# Patient Record
Sex: Male | Born: 1955 | Race: Black or African American | Hispanic: No | Marital: Married | Smoking: Never smoker
Health system: Southern US, Community
[De-identification: ages and names within clinical notes are randomized; demographics above are authoritative.]

---

## 2007-03-09 ENCOUNTER — Emergency Department: Payer: Self-pay | Admitting: Emergency Medicine

## 2012-08-31 ENCOUNTER — Emergency Department: Payer: Self-pay | Admitting: Emergency Medicine

## 2012-08-31 LAB — CBC
HCT: 40.8 % (ref 40.0–52.0)
MCH: 34.3 pg — ABNORMAL HIGH (ref 26.0–34.0)
MCV: 100 fL (ref 80–100)
Platelet: 182 10*3/uL (ref 150–440)
RBC: 4.09 10*6/uL — ABNORMAL LOW (ref 4.40–5.90)
WBC: 4 10*3/uL (ref 3.8–10.6)

## 2012-08-31 LAB — BASIC METABOLIC PANEL
Anion Gap: 10 (ref 7–16)
BUN: 8 mg/dL (ref 7–18)
Creatinine: 0.9 mg/dL (ref 0.60–1.30)
EGFR (African American): >60
Glucose: 104 mg/dL — ABNORMAL HIGH (ref 65–99)
Osmolality: 278 (ref 275–301)
Sodium: 140 mmol/L (ref 136–145)

## 2012-08-31 LAB — TROPONIN I: Troponin-I: <0.02 ng/mL

## 2012-09-01 LAB — HEPATIC FUNCTION PANEL A (ARMC)
Alkaline Phosphatase: 95 U/L (ref 50–136)
Bilirubin, Direct: 0.2 mg/dL (ref 0.00–0.20)
SGOT(AST): 193 U/L — ABNORMAL HIGH (ref 15–37)
SGPT (ALT): 82 U/L — ABNORMAL HIGH (ref 12–78)
Total Protein: 8 g/dL (ref 6.4–8.2)

## 2012-09-01 LAB — ETHANOL: Ethanol: 192 mg/dL

## 2012-10-02 ENCOUNTER — Emergency Department: Payer: Self-pay

## 2012-10-02 LAB — URINALYSIS, COMPLETE
Ketone: NEGATIVE
Nitrite: NEGATIVE
Protein: NEGATIVE
RBC,UR: <1 /HPF (ref 0–5)
Specific Gravity: 1.038 (ref 1.003–1.030)
WBC UR: <1 /HPF (ref 0–5)

## 2012-10-02 LAB — COMPREHENSIVE METABOLIC PANEL
Alkaline Phosphatase: 109 U/L (ref 50–136)
Anion Gap: 8 (ref 7–16)
Calcium, Total: 8.8 mg/dL (ref 8.5–10.1)
Chloride: 113 mmol/L — ABNORMAL HIGH (ref 98–107)
Co2: 25 mmol/L (ref 21–32)
EGFR (Non-African Amer.): >60
Glucose: 83 mg/dL (ref 65–99)
Osmolality: 289 (ref 275–301)
SGOT(AST): 148 U/L — ABNORMAL HIGH (ref 15–37)
SGPT (ALT): 68 U/L (ref 12–78)
Total Protein: 8.2 g/dL (ref 6.4–8.2)

## 2012-10-02 LAB — CBC
HCT: 39.6 % — ABNORMAL LOW (ref 40.0–52.0)
HGB: 13.5 g/dL (ref 13.0–18.0)
MCHC: 34.2 g/dL (ref 32.0–36.0)
MCV: 100 fL (ref 80–100)
RBC: 3.98 10*6/uL — ABNORMAL LOW (ref 4.40–5.90)
RDW: 13.6 % (ref 11.5–14.5)
WBC: 3.9 10*3/uL (ref 3.8–10.6)

## 2012-10-02 LAB — DRUG SCREEN, URINE
Barbiturates, Ur Screen: NEGATIVE (ref ?–200)
Benzodiazepine, Ur Scrn: NEGATIVE (ref ?–200)
Cannabinoid 50 Ng, Ur ~~LOC~~: POSITIVE (ref ?–50)
Cocaine Metabolite,Ur ~~LOC~~: POSITIVE (ref ?–300)
MDMA (Ecstasy)Ur Screen: NEGATIVE (ref ?–500)
Opiate, Ur Screen: NEGATIVE (ref ?–300)
Tricyclic, Ur Screen: NEGATIVE (ref ?–1000)

## 2012-10-02 LAB — ETHANOL: Ethanol: 229 mg/dL

## 2012-11-09 ENCOUNTER — Emergency Department: Payer: Self-pay | Admitting: Unknown Physician Specialty

## 2012-11-09 LAB — COMPREHENSIVE METABOLIC PANEL
Anion Gap: 8 (ref 7–16)
BUN: 17 mg/dL (ref 7–18)
Bilirubin,Total: 1.1 mg/dL — ABNORMAL HIGH (ref 0.2–1.0)
Calcium, Total: 9 mg/dL (ref 8.5–10.1)
Chloride: 100 mmol/L (ref 98–107)
Creatinine: 1.07 mg/dL (ref 0.60–1.30)
EGFR (African American): >60
Potassium: 4 mmol/L (ref 3.5–5.1)
SGOT(AST): 90 U/L — ABNORMAL HIGH (ref 15–37)
SGPT (ALT): 53 U/L (ref 12–78)
Sodium: 136 mmol/L (ref 136–145)

## 2012-11-09 LAB — CBC
HCT: 39.5 % — ABNORMAL LOW (ref 40.0–52.0)
HGB: 13.5 g/dL (ref 13.0–18.0)
MCH: 34.3 pg — ABNORMAL HIGH (ref 26.0–34.0)
MCHC: 34.3 g/dL (ref 32.0–36.0)
MCV: 100 fL (ref 80–100)
RBC: 3.95 10*6/uL — ABNORMAL LOW (ref 4.40–5.90)
RDW: 14.1 % (ref 11.5–14.5)

## 2012-11-09 LAB — URINALYSIS, COMPLETE
Bilirubin,UR: NEGATIVE
Blood: NEGATIVE
Glucose,UR: NEGATIVE mg/dL (ref 0–75)
Nitrite: NEGATIVE
Ph: 5 (ref 4.5–8.0)
Specific Gravity: 1.03 (ref 1.003–1.030)
WBC UR: 13 /HPF (ref 0–5)

## 2012-11-09 LAB — LIPASE, BLOOD: Lipase: 195 U/L (ref 73–393)

## 2012-11-09 LAB — ETHANOL
Ethanol %: <0.003 % (ref 0.000–0.080)
Ethanol: <3 mg/dL

## 2012-11-10 ENCOUNTER — Emergency Department: Payer: Self-pay | Admitting: Emergency Medicine

## 2012-11-10 LAB — DRUG SCREEN, URINE
Barbiturates, Ur Screen: NEGATIVE (ref ?–200)
Benzodiazepine, Ur Scrn: NEGATIVE (ref ?–200)
Cannabinoid 50 Ng, Ur ~~LOC~~: POSITIVE (ref ?–50)
Cocaine Metabolite,Ur ~~LOC~~: POSITIVE (ref ?–300)
Methadone, Ur Screen: NEGATIVE (ref ?–300)
Opiate, Ur Screen: NEGATIVE (ref ?–300)
Phencyclidine (PCP) Ur S: NEGATIVE (ref ?–25)
Tricyclic, Ur Screen: NEGATIVE (ref ?–1000)

## 2012-11-10 LAB — CK TOTAL AND CKMB (NOT AT ARMC): CK-MB: 0.7 ng/mL (ref 0.5–3.6)

## 2015-03-18 IMAGING — CR DG CHEST 2V
1 series · 2 of 2 positions shown · non-contrast
Comparison: none

REASON FOR EXAM: Chest Pain
COMMENTS:

PROCEDURE:     DXR - DXR CHEST PA (OR AP) AND LATERAL  - August 31, 2012 [DATE]
RESULT:     The lungs are clear. The cardiac silhouette and visualized bony
skeleton are unremarkable.

[Series 1: w chest pa · 0.14mm/px · 2 of 2 slices shown]
[im 1/2]
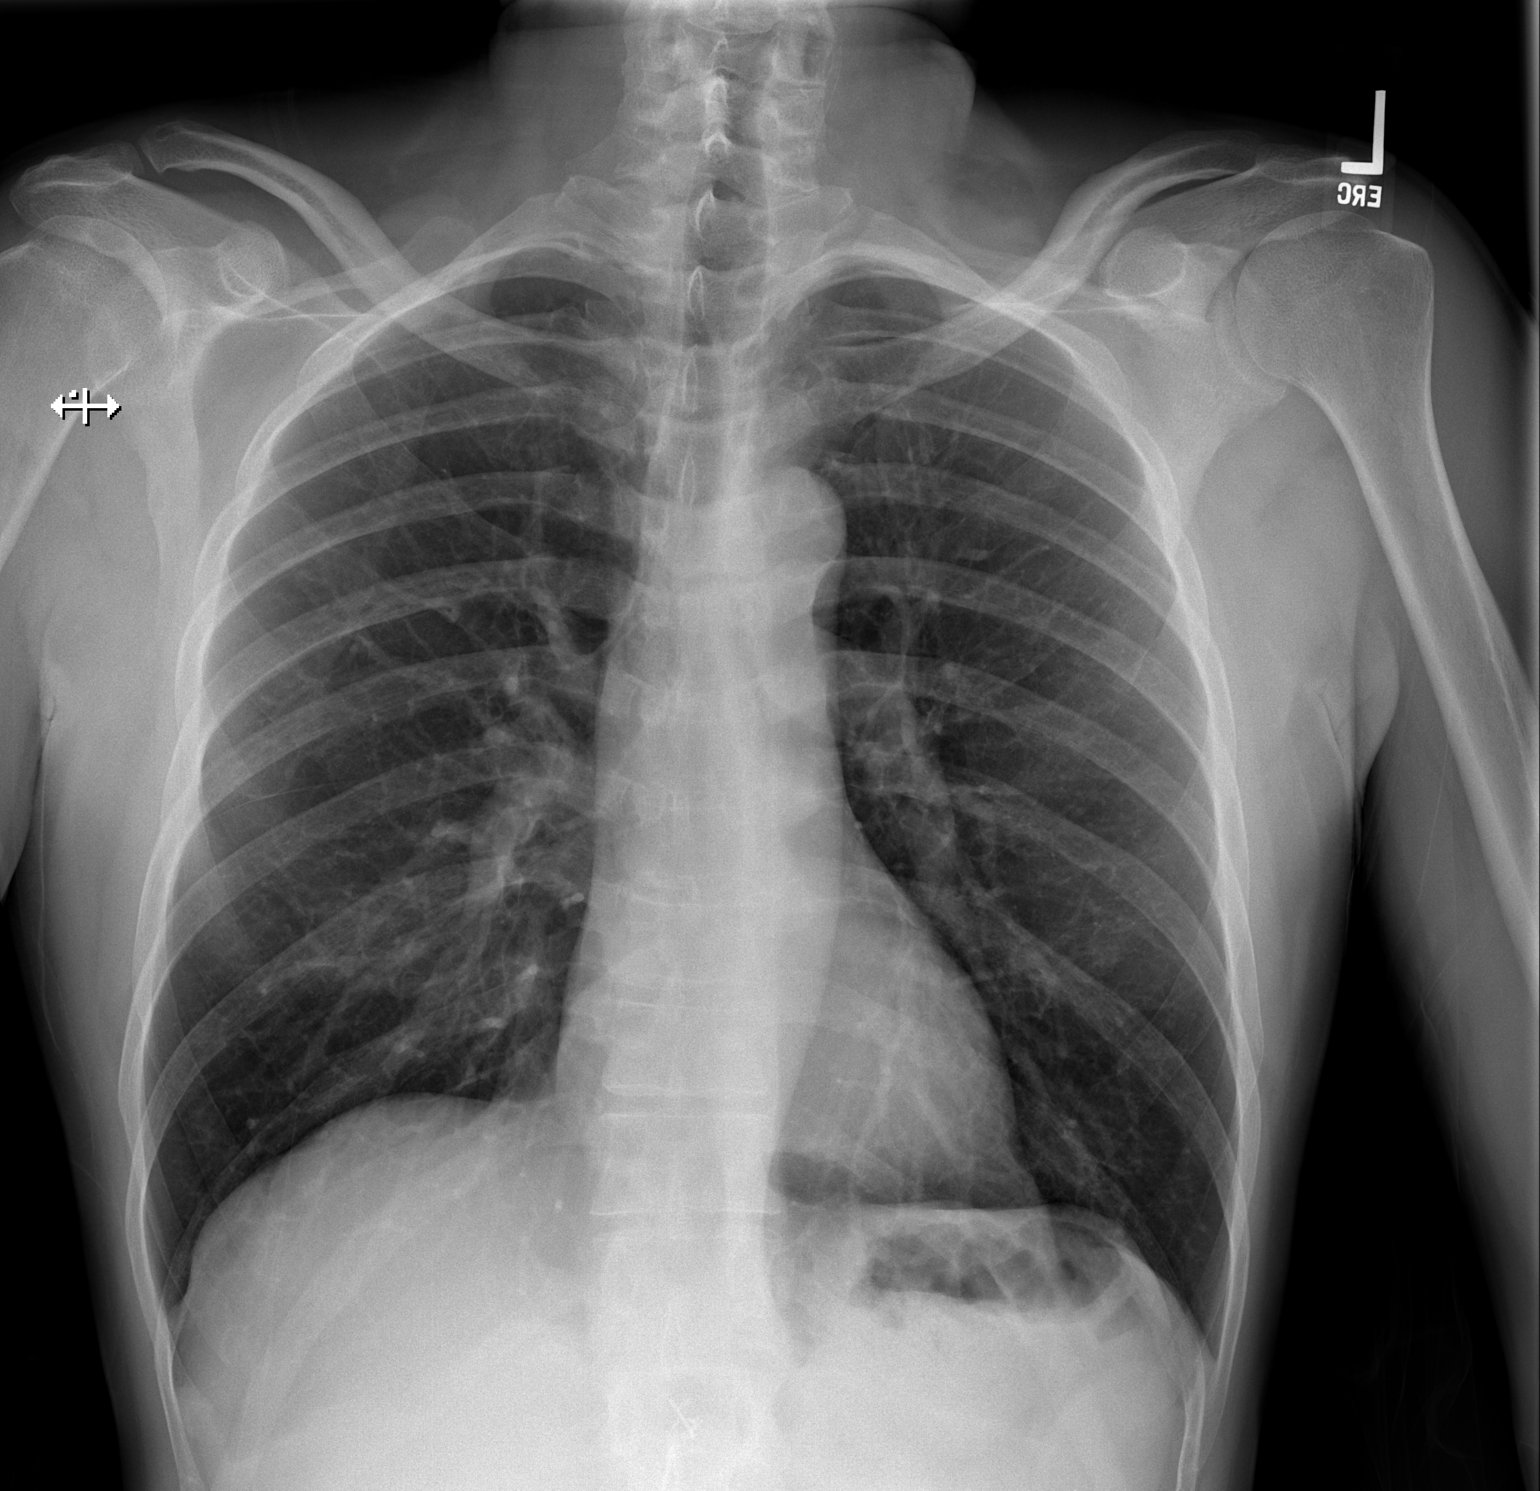
[im 2/2]
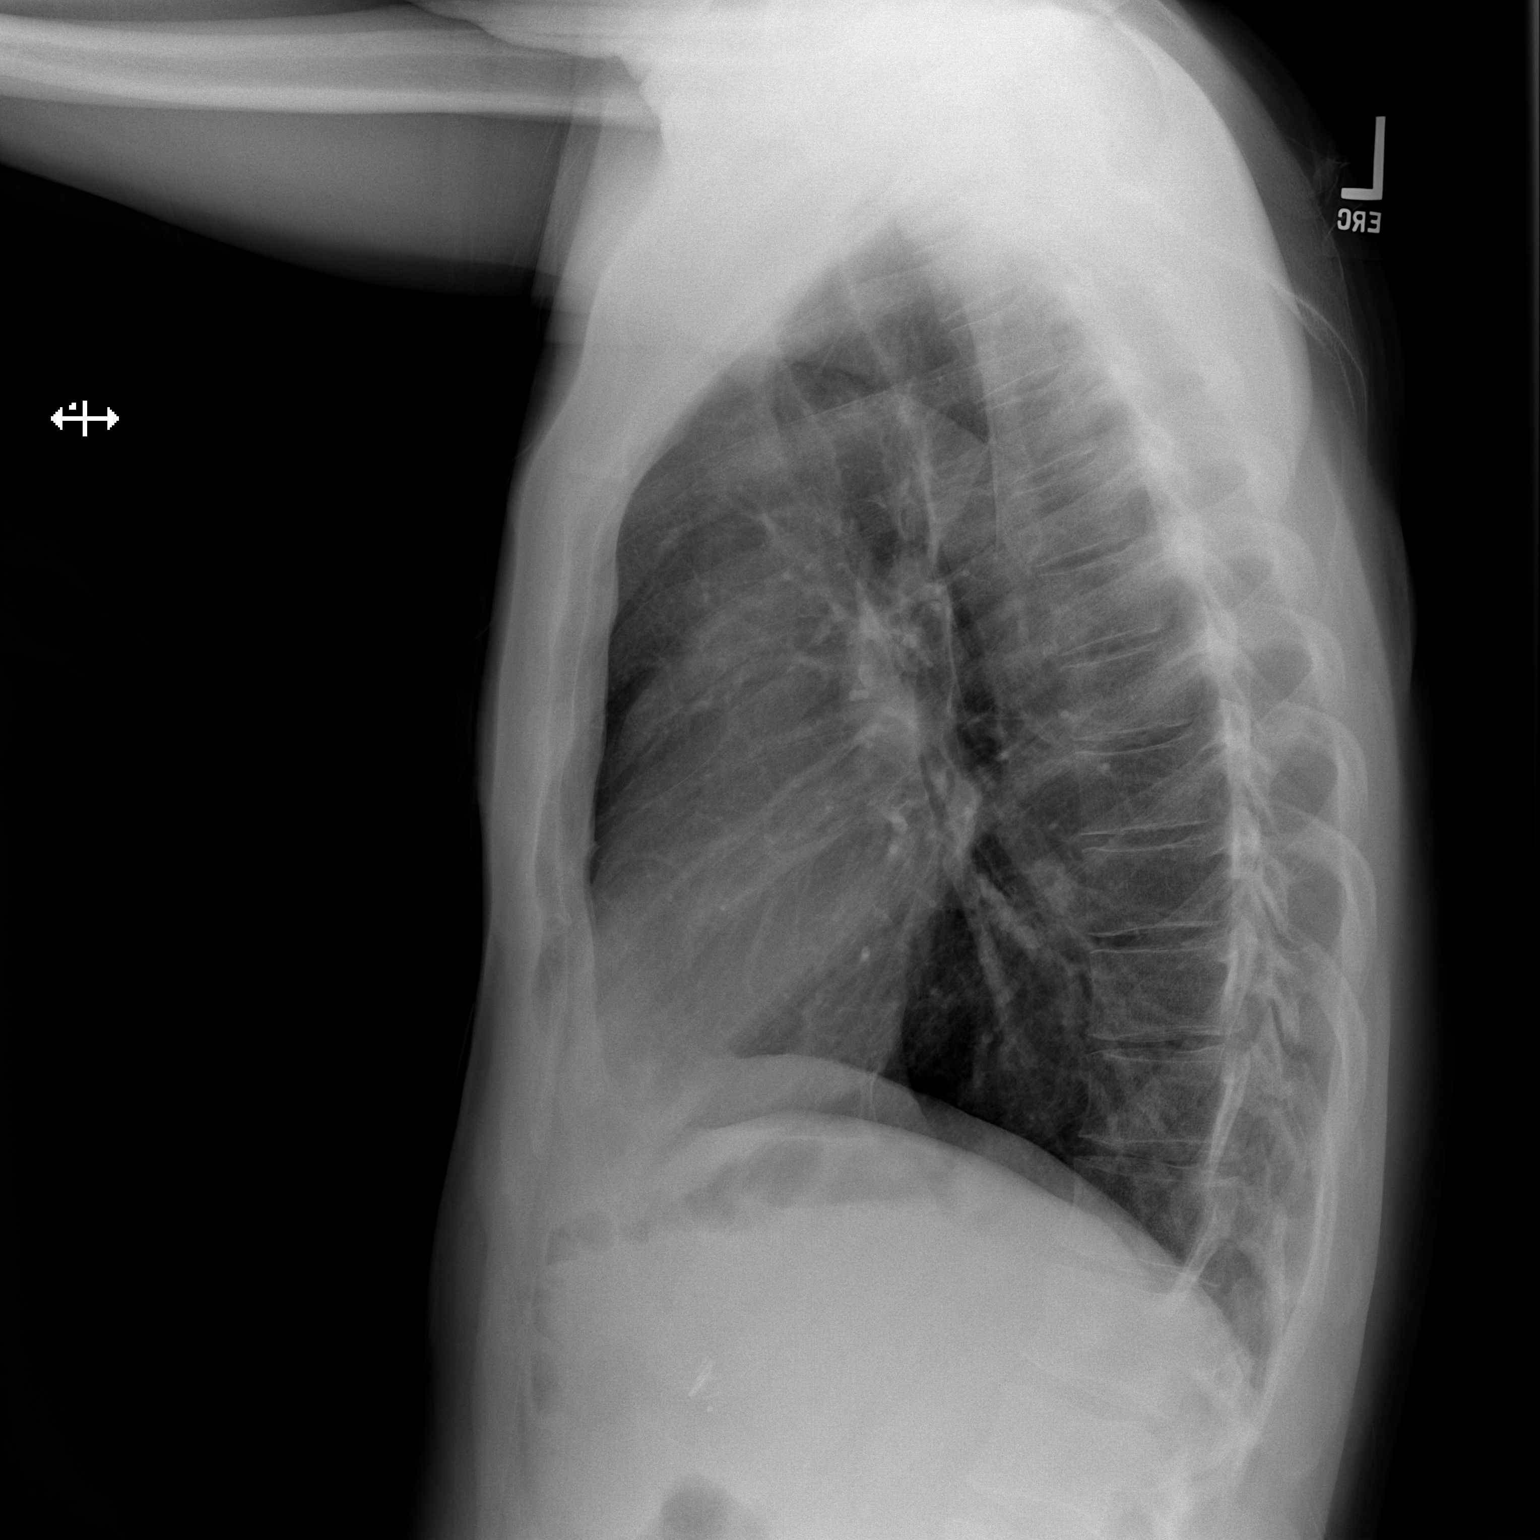

[2 of 2 positions shown; findings below may reference images not displayed]

IMPRESSION: 1. Chest radiograph without evidence of acute cardiopulmonary disease.

## 2022-05-22 ENCOUNTER — Emergency Department: Payer: Self-pay

## 2022-05-22 ENCOUNTER — Other Ambulatory Visit: Payer: Self-pay

## 2022-05-22 ENCOUNTER — Emergency Department
Admission: EM | Admit: 2022-05-22 | Discharge: 2022-05-22 | Disposition: A | Discharge status: Home / Self Care | Payer: Self-pay | Attending: Emergency Medicine | Admitting: Emergency Medicine

## 2022-05-22 DIAGNOSIS — F10129 Alcohol abuse with intoxication, unspecified: Secondary | ICD-10-CM | POA: Insufficient documentation

## 2022-05-22 DIAGNOSIS — W19XXXA Unspecified fall, initial encounter: Secondary | ICD-10-CM

## 2022-05-22 DIAGNOSIS — W1830XA Fall on same level, unspecified, initial encounter: Secondary | ICD-10-CM | POA: Insufficient documentation

## 2022-05-22 DIAGNOSIS — F10920 Alcohol use, unspecified with intoxication, uncomplicated: Secondary | ICD-10-CM

## 2022-05-22 MED ORDER — ACETAMINOPHEN 500 MG PO TABS
1000.0000 mg | ORAL_TABLET | Freq: Once | ORAL | Status: AC
  Administered 2022-05-22: 1000 mg via ORAL
  Filled 2022-05-22: qty 2
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
## 2022-05-22 NOTE — ED Triage Notes (Signed)
Pt BIB EMS, called for fall. Pt states his boots "slipped out from under" him. ETOH on board, c/o pain from "her to here," pt is pointing to his abdomen and down his legs. Is able to move all extremities. Pt is unwilling to provide more information.

## 2022-05-22 NOTE — ED Notes (Signed)
Pt unwilling/unable to actively participate in triage, pt is making repetitive statements and not answering questions

## 2022-05-22 NOTE — Discharge Instructions (Addendum)
CAT scans of your head and neck did not show any abnormality.  We also got x-rays of your spine and pelvis which did not show any broken bones.  You can take Tylenol for pain.  If any your pain is worsening you develop any numbness or weakness in your legs please return the emergency department.

## 2022-05-25 ENCOUNTER — Encounter: Payer: Self-pay | Admitting: Emergency Medicine

## 2022-05-25 DIAGNOSIS — U071 COVID-19: Secondary | ICD-10-CM | POA: Insufficient documentation

## 2022-05-25 LAB — RESP PANEL BY RT-PCR (RSV, FLU A&B, COVID)  RVPGX2
Influenza A by PCR: NEGATIVE
Influenza B by PCR: NEGATIVE
Resp Syncytial Virus by PCR: NEGATIVE
SARS Coronavirus 2 by RT PCR: POSITIVE — AB
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
## 2022-05-26 ENCOUNTER — Emergency Department
Admission: EM | Admit: 2022-05-26 | Discharge: 2022-05-26 | Disposition: A | Discharge status: Home / Self Care | Payer: Self-pay | Attending: Emergency Medicine | Admitting: Emergency Medicine

## 2022-05-26 DIAGNOSIS — U071 COVID-19: Secondary | ICD-10-CM

## 2022-05-26 MED ORDER — NIRMATRELVIR/RITONAVIR (PAXLOVID)TABLET: 3.0000 | ORAL_TABLET | Freq: Two times a day (BID) | ORAL | 0 refills | Status: AC

## 2022-05-26 MED ORDER — IBUPROFEN 800 MG PO TABS
800.0000 mg | ORAL_TABLET | Freq: Once | ORAL | Status: AC
  Administered 2022-05-26: 800 mg via ORAL
  Filled 2022-05-26: qty 1

## 2022-05-26 NOTE — ED Notes (Signed)
E-signature pad unavailable - Pt verbalized understanding of D/C information - no additional concerns at this time.  

## 2022-05-26 NOTE — ED Notes (Addendum)
Pt upset stating he wanted "XR & CT's done on my chest". This RN explained to the patient that he has been thoroughly assessed by an ED provider and the additional testing wasn't warranted. Pt verbalized understanding and escorted to the lobby.

## 2022-05-26 NOTE — Discharge Instructions (Addendum)
You have been diagnosed with COVID 19.  This is a virus that can cause many different symptoms and can be extremely contagious.    You may be eligible for outpatient antiviral treatments for COVID 19 such as Paxlovid, Molnupiravir if you are within the first 5 days of symptoms. You do not need antibiotics for COVID 19 since it is a virus.  You may use over the counter medications to help manage your symptoms at home.    You may alternate Tylenol 1000 mg every 6 hours as needed for pain, fever (as long as you have no history of liver dysfunction, heavy alcohol use) and Ibuprofen 800 mg every 8 hours as needed for pain, fever (as long as you have no history of kidney dysfunction, upper GI gleed, gastritis/ulcer).  Please take Ibuprofen with food.  Do not take more than 4000 mg of Tylenol (acetaminophen) in a 24 hour period.  Please rest and drink plenty of fluids.  You will need to quarantine from others for five days (first day of symptoms is DAY ZERO).  If your symptoms are improving or resolved at the end of this time frame, you may come out of quarantine but will need to wear a well fitted mask when around others for the next 5 days.   The best way to protect yourself and others from [place] 19 and potential long term complications is to be vaccinated and receive boosters as recommended by the [facility] and your primary care provider.  If you develop shortness of breath, blue lips or blue fingertips, vomiting that does not stop, chest pain, confusion, become severely weak or feel you may pass out, please return to the closest emergency department.   Please go to the following website to schedule new (and existing) patient appointments:   [email or web address]   The following is a list of primary care offices in the area who are accepting new patients at this time.  Please reach out to one of them directly and let them know you would like to schedule an appointment to follow  up on an Emergency Department visit, and/or to establish a new primary care provider (PCP).  There are likely other primary care clinics in the are who are accepting new patients, but this is an excellent place to start:  [facility] physician: Dr Lavon Paganini [address] [place], [place] [address] [phone]  [facility] Lead Physician: Dr Steele Sizer [address], [place], [place] [address] [phone]  [facility] Physician: Dr Park Liter [address] [employer] [place], [place] [address] [phone]  [facility] Lead Physician: Dr Dewaine Oats [address] [place], [place] [address] [phone]  [facility] Physician: Dr Halina Maidens [address] [place], [place],  [address] [phone]

## 2022-05-26 NOTE — ED Provider Notes (Signed)
[facility] Provider Note    Event Date/Time   First MD Initiated Contact with Patient 05/26/22 0030     (approximate)   History   Cough   HPI  Tony Murray is a 67 y.o. male with history of alcohol abuse who presents to the emergency department with complaints of not feeling well today.  Brother and mother tested positive for COVID-19 and mother is currently in the hospital.  Reports cough, body aches.  No shortness of breath.  No vomiting or diarrhea.  Has been drinking alcohol the night.   History provided by patient.    History reviewed. No pertinent past medical history.  History reviewed. No pertinent surgical history.  MEDICATIONS:  Prior to Admission medications   Not on File    Physical Exam   Triage Vital Signs: ED Triage Vitals  Enc Vitals Group     BP 05/25/22 2208 (!) 113/58     Pulse Rate 05/25/22 2208 79     Resp 05/25/22 2208 18     Temp 05/25/22 2208 98 F (36.7 C)     Temp Source 05/25/22 2208 Oral     SpO2 05/25/22 2208 98 %     Weight 05/25/22 2207 180 lb (81.6 kg)     Height 05/25/22 2207 '6\' 2"'$  (1.88 m)     Head Circumference --      Peak Flow --      Pain Score 05/25/22 2207 0     Pain Loc --      Pain Edu? --      Excl. in [place]? --     Most recent vital signs: Vitals:   05/25/22 2208  BP: (!) 113/58  Pulse: 79  Resp: 18  Temp: 98 F (36.7 C)  SpO2: 98%    CONSTITUTIONAL: Alert, responds appropriately to questions. Well-appearing; well-nourished HEAD: Normocephalic, atraumatic EYES: Conjunctivae clear, pupils appear equal, sclera nonicteric ENT: normal nose; moist mucous membranes NECK: Supple, normal ROM CARD: RRR; S1 and S2 appreciated RESP: Normal chest excursion without splinting or tachypnea; breath sounds clear and equal bilaterally; no wheezes, no rhonchi, no rales, no hypoxia or respiratory distress, speaking full sentences ABD/GI: Non-distended; soft, non-tender, no rebound, no guarding, no  peritoneal signs BACK: The back appears normal EXT: Normal ROM in all joints; no deformity noted, no edema SKIN: Normal color for age and race; warm; no rash on exposed skin NEURO: Moves all extremities equally, normal speech PSYCH: The patient's mood and manner are appropriate.   ED Results / Procedures / Treatments   LABS: (all labs ordered are listed, but only abnormal results are displayed) Labs Reviewed  RESP PANEL BY RT-PCR (RSV, FLU A&B, COVID)  [id number] - Abnormal; Notable for the following components:      Result Value   SARS Coronavirus 2 by RT PCR POSITIVE (*)    All other components within normal limits     EKG:   RADIOLOGY: My personal review and interpretation of imaging:    I have personally reviewed all radiology reports.   No results found.   PROCEDURES:  Critical Care performed: No     Procedures    IMPRESSION / MDM / ASSESSMENT AND PLAN / ED COURSE  I reviewed the triage vital signs and the nursing notes.    Patient here with symptoms of viral URI with recent exposure to COVID-19.     DIFFERENTIAL DIAGNOSIS (includes but not limited to):   COVID, flu, other viral URI,  pneumonia   Patient's presentation is most consistent with acute complicated illness / injury requiring diagnostic workup.   PLAN: Patient is COVID-positive.  Within treatment window for Paxlovid.  Provided prescription today.  Will give ibuprofen here.  Patient has been drinking alcohol but ambulatory, eating and drinking.  Will discharge with supportive care instructions.   MEDICATIONS GIVEN IN ED: Medications  ibuprofen (ADVIL) tablet 800 mg (has no administration in time range)     ED COURSE:  At this time, I do not feel there is any life-threatening condition present. I reviewed all nursing notes, vitals, pertinent previous records.  All lab and urine results, EKGs, imaging ordered have been independently reviewed and interpreted by myself.  I reviewed all available  radiology reports from any imaging ordered this visit.  Based on my assessment, I feel the patient is safe to be discharged home without further emergent workup and can continue workup as an outpatient as needed. Discussed all findings, treatment plan as well as usual and customary return precautions.  They verbalize understanding and are comfortable with this plan.  Outpatient follow-up has been provided as needed.  All questions have been answered.    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       FINAL CLINICAL IMPRESSION(S) / ED DIAGNOSES   Final diagnoses:  [id number]     Rx / DC Orders   ED Discharge Orders          Ordered    nirmatrelvir/ritonavir (PAXLOVID) 20 x 150 MG & 10 x '100MG'$  TABS  2 times daily        05/26/22 0031             Note:  This document was prepared using Dragon voice recognition software and may include unintentional dictation errors.   Esli Clements, Delice Bison, DO 05/26/22 [phone]

## 2024-03-26 NOTE — ED Provider Notes (Signed)
 ------------------------------------------------------------------------------- Attestation signed by Manette Arlean POUR, DO at 04/06/2024  8:34 AM For this patient encounter, I was the Clinical Oversight Physician. This includes discussion of the plan of care for this patient, independent examination, and a substantive portion of the visit completed by me, the physician. I personally made/approved the management plan and take responsibility for the patient management.  The substantive portion I performed was: medical decision making.  I was the supervising physician for procedure. I reviewed images and agree   Arlean POUR Manette, DO -------------------------------------------------------------------------------   ED Provider Note  Subjective   History of Present Illness This is a 68 year old male presenting with a painful mass on his back that has spontaneous discharge.  The patient reports that the mass on his back began causing discomfort approximately 3 to 4 days ago, with drainage starting last night. The discharge is described as whitish-yellow in color and malodorous. He recalls a similar incident several years ago but is uncertain if it was in the same location. He reports no systemic symptoms such as fever, chills, night sweats, or vomiting. He also reports no headaches.   Objective   Vitals <redacted file path>: ED Triage Vitals [03/26/24 0845]  BP Pulse Resp Temp SpO2 O2 Flow Rate (L/min)  (!) 174/74 79 16 98 F (36.7 C) 97 % --   Physical Exam Vitals and nursing note reviewed.  HENT:     Head: Normocephalic and atraumatic.     Right Ear: External ear normal.     Left Ear: External ear normal.     Nose: No rhinorrhea.  Eyes:     Extraocular Movements: Extraocular movements intact.     Conjunctiva/sclera: Conjunctivae normal.     Pupils: Pupils are equal, round, and reactive to light.  Cardiovascular:     Rate and Rhythm: Normal rate and regular rhythm.     Pulses: Normal  pulses.     Heart sounds: Normal heart sounds.  Pulmonary:     Effort: Pulmonary effort is normal.     Breath sounds: Normal breath sounds. No wheezing or rhonchi.  Chest:     Chest wall: No tenderness.  Abdominal:     Tenderness: There is no abdominal tenderness. There is no guarding.  Musculoskeletal:        General: No deformity or signs of injury.     Right lower leg: No edema.     Left lower leg: No edema.  Skin:    General: Skin is warm and dry.     Capillary Refill: Capillary refill takes less than 2 seconds.     Coloration: Skin is not pale.     Findings: Erythema and lesion present. No rash.     Comments: 5 cm by 3 cm mass on the right side of back and midclavicular line.  Picture included below.  Will ultrasound for further diagnostic value  Neurological:     General: No focal deficit present.     Mental Status: He is oriented to person, place, and time. Mental status is at baseline.     Motor: No weakness.     Coordination: Coordination normal.     Gait: Gait normal.  Psychiatric:        Mood and Affect: Mood normal.        Behavior: Behavior normal.        Thought Content: Thought content normal.        Assessment and Plan:     Medical Decision Making 68 year old male presenting with  right mid back mass that is recurrent.  Ultrasound to evaluate if superficial versus deep connecting mass.  Ultrasound shows superficial fluid collection concerning for abscess or cyst.  As patient is having severe pain with this will provide symptomatic relief with incision and drainage this also provide diagnostic value through fluid type.  Fluid was purulent in nature concerning for abscess and infection.  No signs of cellulitis at this time, no erythema, induration and no cobblestoning on ultrasound.  There is a scar that is well-healing overlying this mass indicating as this is recurrent in nature will have patient follow-up with dermatology and have doxycycline  antibiotic  prescription.  Patient is hemodynamically stable without fever or other systemic signs of infection at this time.  He is safe for discharge home and outpatient follow-up. provided patient with return precaution instructions as well as follow-up instructions.  He expressed understanding and agreement to plan.  See ED course for additional information.  Risk OTC drugs. Prescription drug management.   Risk in the MDM section refers to billing criteria on potential for complications and/or morbidity/mortality of management as defined by the [facility] and CMS  ED Course <redacted file path>:     Procedure: POC US  LIMITED - ABSCESS EVAL  Date/Time: 03/26/2024 9:46 AM  Performed by: Mathew Marty BIRCH, PA-C Authorized by: Manette Arlean POUR, DO   Exam Occurrence/ Category:  Original Exam Findings:    Indication/ Medical Necessity:  Skin swelling, Mass and Pain   Location:  Upper back   Using the linear probe, the area of skin in question showing the above signs was evaluated and showed:  Subcutaneous fluid collection   Overall Findings/ Impression:  No sonographic evidence of cellulitis and Sonographic evidence of abcess (vs cyst)   Further Imaging:  No clinical evidence requiring further imaging   QUALITY ASSURANCE: THESE IMAGES WERE RECORDED FOR QUALITY ASSURANCE RETRIEVABILITY AND ARCHIVAL PURPOSES     Performed by:  ED Advanced Care Provider Additional Procedure Comments:     Images were reviewed with attending physician Arlean Manette DO prior to the completion of exam. ED INCISION AND DRAINAGE (PROCEDURE BASIC)  Date/Time: 03/26/2024 10:40 AM  Performed by: Mathew Marty BIRCH, PA-C Authorized by: Mathew Marty BIRCH, PA-C   Consent:    Consent obtained:  Verbal   Consent given by:  Patient   Risks discussed:  Bleeding, incomplete drainage, pain and infection   Alternatives discussed:  No treatment Universal protocol:    Procedure explained and questions answered to patient or proxy's satisfaction: yes      Imaging studies available: yes (Ultrasound prior to I&D)     Site/side marked: yes     Patient identity confirmed:  Verbally with patient and arm band Location:    Type:  Abscess   Size:  5 cm x 3 cm   Location:  Trunk   Trunk location:  Back Pre-procedure details:    Skin preparation:  Chlorhexidine with alcohol Sedation:    Sedation type:  None Anesthesia:    Anesthesia method:  Local infiltration   Local anesthetic:  Lidocaine 1% WITH epi Procedure type:    Complexity:  Simple Procedure details:    Ultrasound guidance: no     Needle aspiration: no     Incision types:  Stab incision and elliptical   Incision depth:  Dermal   Wound management:  Probed and deloculated   Drainage:  Bloody and purulent   Drainage amount:  Moderate   Wound treatment:  Wound left open (  Placed abdominal gauze pad with tape securing in place over wound to absorb any additional drainage)   Packing materials:  None (Patient is unable to reach the location of does not have assistance at home for packing) Post-procedure details:    Procedure completion:  Tolerated well, no immediate complications     Clinical Impression <redacted file path>: 1. Abscess of back     Condition: Stable and Improved Disposition <redacted file path>:Discharged     For this patient encounter, Dr. Arlean MARLA Manuel, DO was the Clinical Oversight Physician. This includes discussion of the plan of care for this patient.  Patient was educated on the use of AI technology to generate documentation based on recorded audio during today's visit.    Marsha Katherene Gretta Mickey. consented to audio recording of visit.

## 2024-04-12 ENCOUNTER — Other Ambulatory Visit: Payer: Self-pay

## 2024-04-12 ENCOUNTER — Emergency Department
Admission: EM | Admit: 2024-04-12 | Discharge: 2024-04-12 | Disposition: A | Discharge status: Home / Self Care | Payer: Self-pay

## 2024-04-12 ENCOUNTER — Emergency Department: Payer: Self-pay

## 2024-04-12 DIAGNOSIS — R112 Nausea with vomiting, unspecified: Secondary | ICD-10-CM

## 2024-04-12 DIAGNOSIS — B349 Viral infection, unspecified: Secondary | ICD-10-CM | POA: Insufficient documentation

## 2024-04-12 DIAGNOSIS — J069 Acute upper respiratory infection, unspecified: Secondary | ICD-10-CM | POA: Insufficient documentation

## 2024-04-12 DIAGNOSIS — J9801 Acute bronchospasm: Secondary | ICD-10-CM | POA: Insufficient documentation

## 2024-04-12 LAB — RESP PANEL BY RT-PCR (RSV, FLU A&B, COVID)  RVPGX2
Influenza A by PCR: NEGATIVE
Influenza B by PCR: NEGATIVE
Resp Syncytial Virus by PCR: NEGATIVE
SARS Coronavirus 2 by RT PCR: NEGATIVE

## 2024-04-12 LAB — CBC
HCT: 34.7 % — ABNORMAL LOW (ref 39.0–52.0)
Hemoglobin: 11.2 g/dL — ABNORMAL LOW (ref 13.0–17.0)
MCH: 31.6 pg (ref 26.0–34.0)
MCHC: 32.3 g/dL (ref 30.0–36.0)
MCV: 98 fL (ref 80.0–100.0)
Platelets: 162 K/uL (ref 150–400)
RBC: 3.54 MIL/uL — ABNORMAL LOW (ref 4.22–5.81)
RDW: 12.6 % (ref 11.5–15.5)
WBC: 7.9 K/uL (ref 4.0–10.5)
nRBC: 0 % (ref 0.0–0.2)

## 2024-04-12 LAB — COMPREHENSIVE METABOLIC PANEL WITH GFR
ALT: 39 U/L (ref 0–44)
AST: 59 U/L — ABNORMAL HIGH (ref 15–41)
Albumin: 4.1 g/dL (ref 3.5–5.0)
Alkaline Phosphatase: 156 U/L — ABNORMAL HIGH (ref 38–126)
Anion gap: 11 (ref 5–15)
BUN: 11 mg/dL (ref 8–23)
CO2: 25 mmol/L (ref 22–32)
Calcium: 8.9 mg/dL (ref 8.9–10.3)
Chloride: 100 mmol/L (ref 98–111)
Creatinine, Ser: 0.77 mg/dL (ref 0.61–1.24)
GFR, Estimated: >60 mL/min
Glucose, Bld: 88 mg/dL (ref 70–99)
Potassium: 4 mmol/L (ref 3.5–5.1)
Sodium: 136 mmol/L (ref 135–145)
Total Bilirubin: 1 mg/dL (ref 0.0–1.2)
Total Protein: 7.8 g/dL (ref 6.5–8.1)

## 2024-04-12 LAB — LIPASE, BLOOD: Lipase: <10 U/L — ABNORMAL LOW (ref 11–51)

## 2024-04-12 MED ORDER — ONDANSETRON 4 MG PO TBDP
4.0000 mg | ORAL_TABLET | Freq: Once | ORAL | Status: AC
  Administered 2024-04-12: 4 mg via ORAL
  Filled 2024-04-12: qty 1

## 2024-04-12 MED ORDER — PREDNISONE 20 MG PO TABS
60.0000 mg | ORAL_TABLET | Freq: Once | ORAL | Status: AC
  Administered 2024-04-12: 60 mg via ORAL
  Filled 2024-04-12: qty 3

## 2024-04-12 MED ORDER — ALBUTEROL SULFATE HFA 108 (90 BASE) MCG/ACT IN AERS: 2.0000 | INHALATION_SPRAY | Freq: Four times a day (QID) | RESPIRATORY_TRACT | 2 refills | Status: AC | PRN

## 2024-04-12 MED ORDER — IPRATROPIUM-ALBUTEROL 0.5-2.5 (3) MG/3ML IN SOLN
3.0000 mL | Freq: Once | RESPIRATORY_TRACT | Status: AC
  Administered 2024-04-12: 3 mL via RESPIRATORY_TRACT
  Filled 2024-04-12: qty 3

## 2024-04-12 MED ORDER — FAMOTIDINE 20 MG PO TABS
20.0000 mg | ORAL_TABLET | Freq: Once | ORAL | Status: AC
  Administered 2024-04-12: 20 mg via ORAL
  Filled 2024-04-12: qty 1

## 2024-04-12 MED ORDER — FAMOTIDINE 20 MG PO TABS: 20.0000 mg | ORAL_TABLET | Freq: Two times a day (BID) | ORAL | 0 refills | Status: AC

## 2024-04-12 MED ORDER — PREDNISONE 20 MG PO TABS: 40.0000 mg | ORAL_TABLET | Freq: Every day | ORAL | 0 refills | Status: AC

## 2024-04-12 MED ORDER — ONDANSETRON 4 MG PO TBDP: 4.0000 mg | ORAL_TABLET | Freq: Three times a day (TID) | ORAL | 0 refills | Status: AC | PRN

## 2024-04-12 NOTE — Discharge Instructions (Signed)
 Your evaluation in the emergency department was overall reassuring.  I do suspect you have a viral syndrome which is causing irritation of your lungs.  I have prescribed you an inhaler as well as a course of steroids and an antacid medication and nausea medication help with your symptoms.  Please follow-up with your primary care provider for any ongoing symptoms, and return to the emergency department with any new or worsening symptoms.

## 2024-04-12 NOTE — ED Provider Notes (Signed)
 "  [facility] Provider Note    Event Date/Time   First MD Initiated Contact with Patient 04/12/24 2028     (approximate)   History   Nasal Congestion  First Nurse Note:  Pt via [facility] from side of the street. Pt c/o hematemesis since 1pm and generalized weakness. Reports he has been around people who has been sick. Pt is A&Ox4 and NAD  142/75 BP 99% on RA 97.9 orally  60 HR   Pt comes with c/o vomiting blood and cough runny nose. Pt states he was around some people that were sick earlier today.   HPI Tony Murray is a 69 y.o. male no known past medical history presents for evaluation of cough, runny nose, vomiting - Patient states over the past day or 2 he has been having notable cough and wheezing, also notes nasal congestion.  Says he has recently been around some other people with similar symptoms.  Did have a couple episodes of posttussive emesis today with streaks of blood, no frank hematemesis.  No abdominal pain.  No black or bloody stools.  Not on blood thinners. - No fever - No difficulty breathing or does not he has been coughing quite a bit - Had otherwise been in his usual state of health       Physical Exam   Triage Vital Signs: ED Triage Vitals  Encounter Vitals Group     BP 04/12/24 1839 (!) 134/92     Girls Systolic BP Percentile --      Girls Diastolic BP Percentile --      Boys Systolic BP Percentile --      Boys Diastolic BP Percentile --      Pulse Rate 04/12/24 1839 60     Resp 04/12/24 1839 18     Temp 04/12/24 1839 98.4 F (36.9 C)     Temp Source 04/12/24 2033 Oral     SpO2 04/12/24 1839 97 %     Weight 04/12/24 1837 140 lb (63.5 kg)     Height 04/12/24 1837 5' 7 (1.702 m)     Head Circumference --      Peak Flow --      Pain Score 04/12/24 1837 6     Pain Loc --      Pain Education --      Exclude from Growth Chart --     Most recent vital signs: Vitals:   04/12/24 1839 04/12/24 2033  BP: (!) 134/92 (!)  156/71  Pulse: 60 72  Resp: 18 18  Temp: 98.4 F (36.9 C) 98.7 F (37.1 C)  SpO2: 97% 99%     General: Awake, no distress.  CV:  Good peripheral perfusion. RRR, RP 2+ Resp:  Normal effort.  Somewhat coarse breath sounds bilaterally with notable end expiratory wheezing, good airflow.  Coughing a lot throughout exam. Abd:  No distention. Nontender to deep palpation throughout    ED Results / Procedures / Treatments   Labs (all labs ordered are listed, but only abnormal results are displayed) Labs Reviewed  LIPASE, BLOOD - Abnormal; Notable for the following components:      Result Value   Lipase <10 (*)    All other components within normal limits  COMPREHENSIVE METABOLIC PANEL WITH GFR - Abnormal; Notable for the following components:   AST 59 (*)    Alkaline Phosphatase 156 (*)    All other components within normal limits  CBC - Abnormal; Notable for the  following components:   RBC 3.54 (*)    Hemoglobin 11.2 (*)    HCT 34.7 (*)    All other components within normal limits  RESP PANEL BY RT-PCR (RSV, FLU A&B, COVID)  [id number]     EKG  N/a   RADIOLOGY Radiology interpreted by myself radiology report reviewed.  No acute pathology identified.    PROCEDURES:  Critical Care performed: No  Procedures   MEDICATIONS ORDERED IN ED: Medications  ipratropium-albuterol  (DUONEB) 0.5-2.5 (3) MG/3ML nebulizer solution 3 mL (has no administration in time range)  ipratropium-albuterol  (DUONEB) 0.5-2.5 (3) MG/3ML nebulizer solution 3 mL (has no administration in time range)  famotidine  (PEPCID ) tablet 20 mg (has no administration in time range)  ondansetron  (ZOFRAN -ODT) disintegrating tablet 4 mg (4 mg Oral Given 04/12/24 2153)  predniSONE  (DELTASONE ) tablet 60 mg (60 mg Oral Given 04/12/24 2153)     IMPRESSION / MDM / ASSESSMENT AND PLAN / ED COURSE  I reviewed the triage vital signs and the nursing notes.                              DDX/MDM/AP: Differential  diagnosis includes, but is not limited to, viral syndrome, consider pneumonia, appears to have had posttussive emesis symptoms do not suspect acute intra-abdominal pathology at this time with very benign exam here and no complaints of abdominal pain.  I do suspect some component of reactive airway disease contributing to presentation, no known history of asthma/COPD.  Regarding hematemesis, appears to have been mild blood streaking, highly doubt clinically significant GI bleed.  Plan: - Labs - Famotidine , Zofran  - Prednisone , DuoNeb - Chest x-ray - Reassess  Patient's presentation is most consistent with acute presentation with potential threat to life or bodily function.   ED course below.  Laboratory workup unremarkable, viral swab negative, chest x-ray negative.  Feeling better after DuoNeb treatment and Zofran .  Remains satting well on room air.  Presentation overall consistent with viral syndrome.  Rx MDI inhaler, prednisone , Zofran , famotidine .  Serial abdominal exams benign.  Plan for PMD follow-up.  ED return precautions in place.  Patient agrees to plan.  Clinical Course as of 04/12/24 2154  [place] Apr 12, 2024  2121 CBC with no cytosis, hemoglobin 11.2, last lab that I can see is from 2024 with a hemoglobin of about 12-no significant change  CMP reviewed, no significant abnormalities.  BUN normal.  Very mild AST elevation.  Lipase normal.  Viral swab negative [MM]  2138 CXR: IMPRESSION: 1. Findings suggestive of viral bronchiolitis versus reactive airway disease.   [MM]    Clinical Course User Index [MM] Clarine Ozell LABOR, MD     FINAL CLINICAL IMPRESSION(S) / ED DIAGNOSES   Final diagnoses:  Upper respiratory tract infection, unspecified type  Bronchospasm  Nausea and vomiting, unspecified vomiting type     Rx / DC Orders   ED Discharge Orders          Ordered    predniSONE  (DELTASONE ) 20 MG tablet  Daily with breakfast        04/12/24 2153    albuterol  (VENTOLIN   HFA) 108 (90 Base) MCG/ACT inhaler  Every 6 hours PRN        04/12/24 2153    famotidine  (PEPCID ) 20 MG tablet  2 times daily        04/12/24 2153    ondansetron  (ZOFRAN -ODT) 4 MG disintegrating tablet  Every 8 hours PRN  04/12/24 2153             Note:  This document was prepared using Dragon voice recognition software and may include unintentional dictation errors.   Clarine Ozell LABOR, MD 04/12/24 2154  "

## 2024-04-12 NOTE — ED Triage Notes (Signed)
 First Nurse Note:  Pt via [facility] from side of the street. Pt c/o hematemesis since 1pm and generalized weakness. Reports he has been around people who has been sick. Pt is A&Ox4 and NAD  142/75 BP 99% on RA 97.9 orally  60 HR

## 2024-04-12 NOTE — ED Triage Notes (Signed)
 Pt comes with c/o vomiting blood and cough runny nose. Pt states he was around some people that were sick earlier today.

## 2024-04-13 ENCOUNTER — Emergency Department: Payer: Self-pay

## 2024-04-13 ENCOUNTER — Other Ambulatory Visit: Payer: Self-pay

## 2024-04-13 ENCOUNTER — Encounter: Payer: Self-pay | Admitting: Emergency Medicine

## 2024-04-13 DIAGNOSIS — J181 Lobar pneumonia, unspecified organism: Secondary | ICD-10-CM | POA: Insufficient documentation

## 2024-04-13 MED ORDER — ALBUTEROL SULFATE HFA 108 (90 BASE) MCG/ACT IN AERS
2.0000 | INHALATION_SPRAY | Freq: Once | RESPIRATORY_TRACT | Status: DC
  Filled 2024-04-13: qty 6.7

## 2024-04-13 MED ORDER — DOXYCYCLINE HYCLATE 100 MG PO TABS
100.0000 mg | ORAL_TABLET | Freq: Once | ORAL | Status: AC
  Administered 2024-04-14: 100 mg via ORAL
  Filled 2024-04-13: qty 1

## 2024-04-13 NOTE — ED Triage Notes (Signed)
 Patient ambulatory to triage with steady gait, without difficulty or distress noted; pt reports being seen yesterday for cough & congestion; st he was rx meds but didn't get them because he had no money to buy them; st he is here to get medications

## 2024-04-14 ENCOUNTER — Emergency Department
Admission: EM | Admit: 2024-04-14 | Discharge: 2024-04-14 | Disposition: A | Payer: Self-pay | Attending: Emergency Medicine | Admitting: Emergency Medicine

## 2024-04-14 DIAGNOSIS — J189 Pneumonia, unspecified organism: Secondary | ICD-10-CM

## 2024-04-14 MED ORDER — ACETAMINOPHEN 500 MG PO TABS
1000.0000 mg | ORAL_TABLET | Freq: Once | ORAL | Status: AC
  Administered 2024-04-14: 1000 mg via ORAL
  Filled 2024-04-14: qty 2

## 2024-04-14 MED ORDER — HYDROCOD POLI-CHLORPHE POLI ER 10-8 MG/5ML PO SUER
5.0000 mL | Freq: Once | ORAL | Status: AC
  Administered 2024-04-14: 5 mL via ORAL
  Filled 2024-04-14: qty 5

## 2024-04-14 MED ORDER — DEXAMETHASONE 4 MG PO TABS
10.0000 mg | ORAL_TABLET | Freq: Once | ORAL | Status: AC
  Administered 2024-04-14: 10 mg via ORAL
  Filled 2024-04-14: qty 3

## 2024-04-14 MED ORDER — DOXYCYCLINE HYCLATE 100 MG PO TABS: 100.0000 mg | ORAL_TABLET | Freq: Two times a day (BID) | ORAL | 0 refills | Status: AC

## 2024-04-14 NOTE — ED Notes (Signed)
 Reviewed D/C information with the patient, pt verbalized understanding. No additional concerns at this time. Pt provided a bus pass.

## 2024-04-14 NOTE — ED Provider Notes (Signed)
 "  [facility] Provider Note    Event Date/Time   First MD Initiated Contact with Patient 04/14/24 0258     (approximate)   History   Cough   HPI  Tony Murray is a 69 y.o. male with history of alcohol use disorder, seizures who presents to the emergency department with cough, congestion, headache, subjective fevers for the past several days.  Was seen in the emergency department yesterday and discharged with albuterol  inhaler and prednisone  but states he was not able to afford to pick up the prescriptions.  Cough is worsened today.  No vomiting or diarrhea.  Patient states that he does not want to be admitted and that he is from [place]  and plans to go back home 3 days from now.   History provided by patient.    History reviewed. No pertinent past medical history.  History reviewed. No pertinent surgical history.  MEDICATIONS:  Prior to Admission medications  Medication Sig Start Date End Date Taking? Authorizing Provider  albuterol  (VENTOLIN  HFA) 108 (90 Base) MCG/ACT inhaler Inhale 2 puffs into the lungs every 6 (six) hours as needed for wheezing or shortness of breath. 04/12/24   Clarine Ozell LABOR, MD  famotidine  (PEPCID ) 20 MG tablet Take 1 tablet (20 mg total) by mouth 2 (two) times daily. 04/12/24 04/26/24  Clarine Ozell LABOR, MD  ondansetron  (ZOFRAN -ODT) 4 MG disintegrating tablet Take 1 tablet (4 mg total) by mouth every 8 (eight) hours as needed for nausea or vomiting. 04/12/24   Clarine Ozell LABOR, MD  predniSONE  (DELTASONE ) 20 MG tablet Take 2 tablets (40 mg total) by mouth daily with breakfast for 4 days. 04/13/24 04/17/24  Clarine Ozell LABOR, MD    Physical Exam   Triage Vital Signs: ED Triage Vitals  Encounter Vitals Group     BP 04/13/24 2317 (!) 164/104     Girls Systolic BP Percentile --      Girls Diastolic BP Percentile --      Boys Systolic BP Percentile --      Boys Diastolic BP Percentile --      Pulse Rate 04/13/24 2317 84     Resp  04/13/24 2317 17     Temp 04/13/24 2317 98.1 F (36.7 C)     Temp Source 04/13/24 2317 Oral     SpO2 04/13/24 2317 95 %     Weight 04/13/24 2317 140 lb (63.5 kg)     Height 04/13/24 2317 5' 11 (1.803 m)     Head Circumference --      Peak Flow --      Pain Score 04/13/24 2320 6     Pain Loc --      Pain Education --      Exclude from Growth Chart --     Most recent vital signs: Vitals:   04/13/24 2317 04/14/24 0359  BP: (!) 164/104 (!) 158/78  Pulse: 84 82  Resp: 17 18  Temp: 98.1 F (36.7 C) 98 F (36.7 C)  SpO2: 95% 100%    CONSTITUTIONAL: Alert, responds appropriately to questions. Well-appearing; well-nourished HEAD: Normocephalic, atraumatic EYES: Conjunctivae clear, pupils appear equal, sclera nonicteric ENT: normal nose; moist mucous membranes NECK: Supple, normal ROM CARD: RRR; S1 and S2 appreciated RESP: Normal chest excursion without splinting or tachypnea; breath sounds clear and equal bilaterally; no wheezes, no rhonchi, no rales, no hypoxia or respiratory distress, speaking full sentences, nonproductive cough ABD/GI: Non-distended; soft, non-tender, no rebound, no guarding, no peritoneal  signs BACK: The back appears normal EXT: Normal ROM in all joints; no deformity noted, no edema, no calf swelling or calf tenderness SKIN: Normal color for age and race; warm; no rash on exposed skin NEURO: Moves all extremities equally, normal speech PSYCH: The patient's mood and manner are appropriate.   ED Results / Procedures / Treatments   LABS: (all labs ordered are listed, but only abnormal results are displayed) Labs Reviewed - No data to display   EKG:  EKG Interpretation Date/Time:    Ventricular Rate:    PR Interval:    QRS Duration:    QT Interval:    QTC Calculation:   R Axis:      Text Interpretation:           RADIOLOGY: My personal review and interpretation of imaging: This x-ray shows left lower lobe pneumonia.  I have personally  reviewed all radiology reports.   DG Chest 2 View Result Date: 04/13/2024 CLINICAL DATA:  Cough and congestion EXAM: CHEST - 2 VIEW COMPARISON:  04/12/2024 FINDINGS: Mild left lower lobe opacity. Normal cardiac size with aortic atherosclerosis. No pleural effusion or pneumothorax IMPRESSION: Mild left lower lobe opacity, mild pneumonia or atelectasis. Electronically Signed   By: Luke Bun M.D.   On: 04/13/2024 23:38     PROCEDURES:  Critical Care performed: No    Procedures    IMPRESSION / MDM / ASSESSMENT AND PLAN / ED COURSE  I reviewed the triage vital signs and the nursing notes.    Patient here with subjective fevers, cough and congestion.  No hypoxia or increased work of breathing.  Lungs currently clear.     DIFFERENTIAL DIAGNOSIS (includes but not limited to):   Viral URI, bronchiolitis, pneumonia, doubt CHF, PE, ACS, pneumothorax   Patient's presentation is most consistent with acute presentation with potential threat to life or bodily function.   PLAN: Repeat chest x-ray obtained today which now shows a left lower lobe pneumonia when reviewed and interpreted by myself and the radiologist.  Will start him on doxycycline  for community-acquired pneumonia.  Lungs are currently clear to auscultation with no increased work of breathing or hypoxia.  We did discuss possibility of admission given his age but patient states that he is not short of breath and would like to be discharged and go home to [place]  as scheduled in 3 days from now.  He states that he should be getting money today to get his doxycycline  filled.  He has been given an albuterol  inhaler here and a dose of Decadron .  Recommended over-the-counter cough suppressants, honey, increase fluid intake.  Patient verbalized understanding and is comfortable with this plan.  Able to ambulate out of the emergency department without difficulty or respiratory distress.   MEDICATIONS GIVEN IN ED: Medications  albuterol   (VENTOLIN  HFA) 108 (90 Base) MCG/ACT inhaler 2 puff (has no administration in time range)  doxycycline  (VIBRA -TABS) tablet 100 mg (100 mg Oral Given 04/14/24 0339)  dexamethasone  (DECADRON ) tablet 10 mg (10 mg Oral Given 04/14/24 0339)  acetaminophen  (TYLENOL ) tablet 1,000 mg (1,000 mg Oral Given 04/14/24 0339)  chlorpheniramine-HYDROcodone (TUSSIONEX) 10-8 MG/5ML suspension 5 mL (5 mLs Oral Given 04/14/24 0339)     ED COURSE:  At this time, I do not feel there is any life-threatening condition present. I reviewed all nursing notes, vitals, pertinent previous records.  All lab and urine results, EKGs, imaging ordered have been independently reviewed and interpreted by myself.  I reviewed all available radiology reports  from any imaging ordered this visit.  Based on my assessment, I feel the patient is safe to be discharged home without further emergent workup and can continue workup as an outpatient as needed. Discussed all findings, treatment plan as well as usual and customary return precautions.  They verbalize understanding and are comfortable with this plan.  Outpatient follow-up has been provided as needed.  All questions have been answered.    CONSULTS:  none   OUTSIDE RECORDS REVIEWED: Reviewed last admission at [facility] in 2023.       FINAL CLINICAL IMPRESSION(S) / ED DIAGNOSES   Final diagnoses:  Pneumonia of left lower lobe due to infectious organism     Rx / DC Orders   ED Discharge Orders          Ordered    doxycycline  (VIBRA -TABS) 100 MG tablet  2 times daily        04/14/24 9688             Note:  This document was prepared using Dragon voice recognition software and may include unintentional dictation errors.   Zoie Sarin, Josette SAILOR, DO 04/14/24 [phone]  "

## 2024-04-14 NOTE — Discharge Instructions (Signed)
 You may take Tylenol  1000 mg every 6 hours as needed for fever and pain.  You may use your albuterol  inhaler 2 to 4 puffs every 2-4 hours as needed for shortness of breath and wheezing.  We have given you a dose of steroids which will last for 72 hours.  Please take your antibiotics twice a day for the next week.
